# Patient Record
Sex: Female | Born: 1965 | Race: White | Hispanic: No | Smoking: Current every day smoker
Health system: Southern US, Community
[De-identification: ages and names within clinical notes are randomized; demographics above are authoritative.]

## PROBLEM LIST (undated history)

## (undated) DIAGNOSIS — I10 Essential (primary) hypertension: Secondary | ICD-10-CM

## (undated) DIAGNOSIS — K8689 Other specified diseases of pancreas: Secondary | ICD-10-CM

## (undated) HISTORY — PX: ABDOMINAL HYSTERECTOMY: SHX81

---

## 1997-10-24 ENCOUNTER — Emergency Department (HOSPITAL_COMMUNITY): Admission: EM | Admit: 1997-10-24 | Discharge: 1997-10-24 | Payer: Self-pay | Admitting: Internal Medicine

## 1997-10-31 ENCOUNTER — Other Ambulatory Visit: Admission: RE | Admit: 1997-10-31 | Discharge: 1997-10-31 | Payer: Self-pay | Admitting: Obstetrics and Gynecology

## 1999-07-19 ENCOUNTER — Encounter (INDEPENDENT_AMBULATORY_CARE_PROVIDER_SITE_OTHER): Payer: Self-pay

## 1999-07-19 ENCOUNTER — Inpatient Hospital Stay (HOSPITAL_COMMUNITY): Admission: RE | Admit: 1999-07-19 | Discharge: 1999-07-22 | Payer: Self-pay | Admitting: Obstetrics & Gynecology

## 1999-10-30 ENCOUNTER — Observation Stay (HOSPITAL_COMMUNITY): Admission: RE | Admit: 1999-10-30 | Discharge: 1999-10-31 | Payer: Self-pay | Admitting: Obstetrics & Gynecology

## 1999-10-30 ENCOUNTER — Encounter (INDEPENDENT_AMBULATORY_CARE_PROVIDER_SITE_OTHER): Payer: Self-pay | Admitting: Specialist

## 2000-10-12 ENCOUNTER — Encounter: Payer: Self-pay | Admitting: Internal Medicine

## 2000-10-12 ENCOUNTER — Inpatient Hospital Stay (HOSPITAL_COMMUNITY): Admission: EM | Admit: 2000-10-12 | Discharge: 2000-10-14 | Payer: Self-pay

## 2000-10-20 ENCOUNTER — Other Ambulatory Visit (HOSPITAL_COMMUNITY): Admission: RE | Admit: 2000-10-20 | Discharge: 2000-10-21 | Payer: Self-pay | Admitting: Psychiatry

## 2001-06-10 ENCOUNTER — Other Ambulatory Visit: Admission: RE | Admit: 2001-06-10 | Discharge: 2001-06-10 | Payer: Self-pay | Admitting: Obstetrics & Gynecology

## 2005-07-28 ENCOUNTER — Emergency Department (HOSPITAL_COMMUNITY): Admission: EM | Admit: 2005-07-28 | Discharge: 2005-07-28 | Payer: Self-pay | Admitting: Emergency Medicine

## 2007-06-01 ENCOUNTER — Emergency Department (HOSPITAL_COMMUNITY): Admission: EM | Admit: 2007-06-01 | Discharge: 2007-06-01 | Payer: Self-pay | Admitting: Emergency Medicine

## 2010-08-24 NOTE — Op Note (Signed)
[facility]  Patient:    Kelsey Scott, Kelsey Scott                        MRN: [id number] Proc. Date: 07/19/99 Adm. Date:  [id number] Attending:  Minette Headland                           Operative Report  PREOPERATIVE DIAGNOSIS:       Chronic pelvic pain, cystourethrocele with loss of UV angle and urinary stress incontinence, second degree rectocele, suspected adenomyosis.  POSTOPERATIVE DIAGNOSIS:      Chronic pelvic pain, cystourethrocele with loss of UV angle and urinary stress incontinence, second degree rectocele, suspected adenomyosis.  OPERATION:                    Laparoscopically-assisted vaginal hysterectomy, anterior and posterior colporrhaphy, suprapubic cystotomy with Bonanno catheter.  SURGEON:                      Freddy Finner, M.D.  ASSISTANT:                    Beather Arbour. Thomasena Edis, M.D.  ANESTHESIA:  ESTIMATED BLOOD LOSS:         100 cc.  COMPLICATIONS:                None.  INDICATIONS:                  Details of the present illness are recorded in the admission note.  FINDINGS:                     Intra-abdominal findings are recorded in still photographs which are included in the office record and they include normal appearing right ovary with evidence of recent ovulation, normal right fallopian  tube, absence of left tube and ovary, normal appendix, boggy enlargement of uterus. No apparent other abnormalities in the upper abdomen.  DESCRIPTION OF PROCEDURE:     The patient was admitted on the morning of surgery. She was placed in PAS hose.  She was given an antibiotic bolus.  She was brought to the operating room and placed under adequate general endotracheal anesthesia and placed in the dorsal lithotomy position using the [place] stirrup system. Betadine prep was carried out in the usual fashion with Betadine scrub followed by solution, prepping the entire abdomen, perineum, and vagina.  The bladder was  evacuated with a Robinson catheter.  Hulka tenaculum was attached to the cervix under direct visualization and sterile drapes were applied.  Two incisions were made in the abdomen one at the umbilicus and one just above the symphysis.  Through the upper incision, a 12 mm trocar was introduced.  Inspection revealed adequate placement with no evidence of injury on entry.  The pneumoperitoneum was allowed to accummulate.  Careful systematic examination of abdominal and pelvic contents was carried out.  Findings are noted above.  There was no visible evidence of adhesions or endometriosis.  It was elected to perform the definitive surgery vaginally at this point.  Attention was turned vaginally.  Posterior weighted vaginal retractor was placed.  Deaver retractors were used to retract the vaginal walls anteriorly and laterally.  Cervix was grasped with a Christella Hartigan tenaculum.  Hulka tenaculum was removed.  Posterior colpotomy incision was made while tenting the mucosa posterior to the cervix in the cul-de-sac.  The cervix was circumscribed with a scalpel to free the mucosa from the cervix.  The uterosacral pedicles were developed, crossclamped with curved Heaneys, divided sharply, and ligated with 0 Monocryl n a Heaney fashion.  The bladder pillars were taken separately, divided sharply, and ligated with 0 Monocryl.  Anterior peritoneum was entered.  Cardinal ligament pedicles, vessel pedicles, and a pedicle above the vessels on each side were taken. Each was divided sharply and ligated with 0 Monocryl.  The utero-ovarian pedicle on the right was crossclamp as was residual infundibulopelvic pedicle on the left.  Each of these was divided.  The left side was doubly ligated with free ties of  Monocryl.  The right was ligated with free tie of 0 Monocryl followed by suture  ligature of 0 Monocryl.  Angles of the vagina were anchored to the uterosacrals  with mattress suture of 0  Monocryl.  Posterior peritoneum and uterosacrals were  plicated with a single suture of 0 Monocryl.  The cuff was closed vertically leaving approximately 2 cm of anterior mucosa free for the anterior repair. The edges of this mucosa were grasped with Allis and the mucosa was tented in the midline and progressively the bladder was separated from the mucosa with blunt nd sharp dissection and the incision was made in the midline to a level approximately 2 cm from the urethral meatus.  Careful sharp and blunt dissection was carried ut until the bladder was freed from the vesicovaginal fashion.  This was plicated ith a series of interrupted 0 Monocryl sutures to elevate the urethrovesical angle nd reduce the small cystocele.  A very small segment of mucosa was excised.  The mucosa was then closed with a running 2-0 Monocryl suture.  Attention was turned first posteriorly.  The vaginal mucosa was grasped at the forchette.  On either  side, a wedge-shaped segment of tissue with base superiorly was taken from the peritoneum and removed.  The posterior mucosa was elevated with Allis, developed with blunt and sharp dissection and incision made in the midline to approximately 6 to 8 cm of length.  Careful dissection of the rectum and perirectal tissues was  carried out as well as dissection of the perineal body.  Plication sutures of 0  Monocryl were used to plicate perirectal tissues in the midline to reduce the rectocele.  The levators were plicated separately to the length and then elevated the perineal body.  The mucosa was then closed with a running 2-0 Monocryl in a  fashion similar to episiotomy.  The bladder was filled with 300 cc of irrigating solution and a suprapubic Bonanno catheter was placed without difficulty and anchored to the skin with 4-0 nylon.  A two-inch Iodoform gauze pack was placed. Reinspection abdominally with the laparoscope revealed complete  hemostasis. Additional photos were made and retained in the office record.  The procedure was terminated.  The umbilical incision was closed in two layers with a deep suture of 0 Vicryl in the fascia and interrupted 3-0 Dexon for subcuticular skin closure.   The lower incision was closed with an interrupted subcuticular suture of 3-0 Dexon and 1/4 inch Steri-Strips.  8 cc of 0.5% Marcaine was used to inject into the incision for postoperative analgesia.  The patient was awakened and taken to the recovery room in good condition. DD:  07/19/99 TD:  07/19/99 Job: [id number]

## 2010-08-24 NOTE — Discharge Summary (Signed)
[facility]  Patient:    Kelsey Scott, Kelsey Scott                          MRN: [id number] Adm. Date:  10/30/99 Disc. Date: 10/31/99 Attending:  Freddy Finner, M.D.                           Discharge Summary  DISCHARGE DIAGNOSIS:          Rectovaginal fistula.  OPERATIVE PROCEDURE:          Resection of rectovaginal fistula and perioneoplasty.  COMPLICATIONS:                None.  DISPOSITION:                  The patient is in satisfactory condition at the time of discharge.  She is to have no vaginal entry.  She is to take a regular diet.  She is to call for fever or heavy bleeding.  She is discharged on Ceftin 500 mg b.i.d. for five days.  She is given Tylox to be taken p.r.n. pain.  She is to do Sitz baths b.i.d. for at least 10 minutes.  She is given Diflucan to be used as needed for yeast vaginitis.  She is to return to the office in one week for her first postoperative visit.  She is to take a stool softener and encouraged to force p.o. fluids.  The patient was admitted ______ after resection of rectovaginal fistula on the day of admission.  Her physical findings on admission were remarkable only for the presence of the fistula.  LABORATORY DATA:              CBC with hemoglobin 14.  Normal urinalysis. Postoperative hemoglobin 11.3.  HOSPITAL COURSE:              The patient was admitted and observed overnight. She did have some emesis, which responded to antiemetics.  She remained afebrile.  On the morning after surgery, she was alert and oriented, ambulating without difficulty, having adequate bladder function and adequate pain control with p.o. medications.  She was discharged to home with disposition as noted above. DD:  10/31/99 TD:  11/02/99 Job: [id number]

## 2010-08-24 NOTE — Op Note (Signed)
[facility]  Patient:    Kelsey Scott, Kelsey Scott                          MRN: [id number] Proc. Date: 10/30/99 Attending:  Freddy Finner, M.D.                           Operative Report  PREOPERATIVE DIAGNOSIS:  Rectovaginal fistula.  POSTOPERATIVE DIAGNOSIS:  Rectovaginal fistula.  OPERATION:  Excision of rectovaginal fistula and perineoplasty.  SURGEON:  Freddy Finner, M.D.  ANESTHESIA:  General.  ESTIMATED INTRAOPERATIVE BLOOD LOSS:  50 cc.  INTRAOPERATIVE COMPLICATIONS:  None.  INDICATIONS:  The patient had laparoscopically-assisted vaginal hysterectomy and anterior and posterior vagina repair on April 12 of this year. Postoperatively, after approximately three to four weeks, she noticed passage of gas rectally.  A small fistula was identified.  She was treated with antibiotics, sitz baths, stool softeners, and has been followed now for a period of approximately 12 to 13 weeks with persistence of her symptoms and with complete healing of the posterior vaginal repair.  She is now admitted for excision of rectovaginal fistula.  DESCRIPTION OF PROCEDURE:  She was admitted on the morning of surgery. She was given a gram of Ceftin IV preoperatively.  She had a modified bowel prep preoperatively.  She was taken to the operating room where she was placed under adequate general endotracheal anesthesia, placed in the dorsolithotomy position using the [employer] system.  Betadine prep of perineum and vagina was carried out.  The perineal or lower vaginal portion of the fistula was identified.  Digitally, the rectum was explored and, using scalpel with a #15 blade, a mucosal incision was made in elliptical fashion that included the rectovaginal fistula superficially.  A scar track could be felt extending for a distance of approximately 2 cm superiorly along the rectovaginal shelf. With careful and sharp dissection, this entire tract was excised.  Edges  of the mucosa were grasped with Allis as the procedure progressed.  After completely excising the track, there was an approximately 1 cm defect in the rectal mucosa.  The first layer of this closure was a running 3-0 Monocryl.  A second layer of closure was used.  This was the submucosal closure for the first layer and a more superficial suture for the second layer.  Interrupted sutures of 0 Monocryl were then placed to reapproximate the sphincter, perineum, and the rectovaginal shelf.  Mucosa was closed with running 3-0 Monocryl in the fascia similar to a episiotomy.  Hemostasis was adequate on completion of the procedure.  Counts were correct.  The patient was awakened and take to the recovery room in good condition. DD:  10/30/99 TD:  10/30/99 Job: [id number]

## 2010-08-24 NOTE — Discharge Summary (Signed)
[facility]  Patient:    Kelsey Scott, Kelsey Scott                        MRN: [id number] Adm. Date:  [id number] Disc. Date: [id number] Attending:  Minette Headland                           Discharge Summary  DISCHARGE DIAGNOSES:          1. Chronic pelvic pain.                               2. Severe dysmenorrhea and menorrhagia unresponsive                                  to conservative management.                               3. Symptomatic cystourethrocele, first degree.                               4. Second degree rectocele, symptomatic.  DISPOSITION:                  The patient was in satisfactory improved condition at the time of her discharge.  She was given instructions for no heavy lifting or vaginal entry.  She was discharged home with suprapubic catheter in place and appropriate instructions.  She was given Tylenol No.3 for pain and instructed to take ibuprofen 600 mg every six hours.  She was given appropriate instructions or residuals.  She is to return to the office in two weeks or when residuals are at the appropriate level.  She is to take a regular diet.  HISTORY OF PRESENT ILLNESS: PAST MEDICAL HISTORY: FAMILY HISTORY: REVIEW OF SYSTEMS: PHYSICAL EXAMINATION:         Recorded in the admission note.  Briefly, the physical findings on admission are remarkable for the pelvic findings.  There was loss of the urethrovesical angle with a very small cystocele.  There was a second degree rectocele with prolapse to the introitus with valsalva.  Bimanual examination revealed the uterus to be slightly increased in size and very tender to palpation.  LABORATORY DATA:              Not included in the medical record at this point n time.  I know from her postoperative notes that her hemoglobin was 11.6 postoperatively.  HOSPITAL COURSE:              The patient was admitted on the morning of surgery. She was given Cefotan 1 gram IV  and she was placed in PAS hose.  She was brought to the operating room where the above described procedure was accomplished.  Her postoperative course was entirely unremarkable for any significant complications. She remained afebrile throughout.  By the morning of the third postoperative day, she was voiding some, but still had large residuals.  Her condition was otherwise considered to be good and she was discharged home with disposition as noted above. DD:  09/03/99 TD:  09/04/99 Job: [id number]

## 2010-12-28 LAB — POCT I-STAT CREATININE: Operator id: 285841

## 2010-12-28 LAB — POCT CARDIAC MARKERS
CKMB, poc: <1 — ABNORMAL LOW
Troponin i, poc: <0.05

## 2010-12-28 LAB — CBC
HCT: 41
Hemoglobin: 13.7
MCHC: 33.5
MCV: 89.8
Platelets: 279
RBC: 4.57
RDW: 13.1
WBC: 5.7

## 2010-12-28 LAB — DIFFERENTIAL
Basophils Absolute: 0
Eosinophils Absolute: 0.1
Eosinophils Relative: 2
Lymphocytes Relative: 32
Neutrophils Relative %: 52

## 2010-12-28 LAB — I-STAT 8, (EC8 V) (CONVERTED LAB)
BUN: 14
Bicarbonate: 25.3 — ABNORMAL HIGH
Glucose, Bld: 164 — ABNORMAL HIGH
Operator id: 285841
TCO2: 27
pCO2, Ven: 44 — ABNORMAL LOW

## 2010-12-28 LAB — D-DIMER, QUANTITATIVE: D-Dimer, Quant: 0.51 — ABNORMAL HIGH

## 2016-03-06 ENCOUNTER — Other Ambulatory Visit: Payer: Self-pay | Admitting: Student

## 2016-03-06 DIAGNOSIS — R1010 Upper abdominal pain, unspecified: Secondary | ICD-10-CM

## 2016-03-08 ENCOUNTER — Other Ambulatory Visit: Payer: Self-pay | Admitting: Family Medicine

## 2016-03-08 DIAGNOSIS — R1010 Upper abdominal pain, unspecified: Secondary | ICD-10-CM

## 2016-03-13 ENCOUNTER — Other Ambulatory Visit: Payer: Self-pay

## 2016-03-18 ENCOUNTER — Other Ambulatory Visit: Payer: Self-pay | Admitting: Family Medicine

## 2016-03-18 DIAGNOSIS — R1010 Upper abdominal pain, unspecified: Secondary | ICD-10-CM

## 2017-08-06 ENCOUNTER — Emergency Department (HOSPITAL_COMMUNITY): Payer: BLUE CROSS/BLUE SHIELD

## 2017-08-06 ENCOUNTER — Emergency Department (HOSPITAL_COMMUNITY)
Admission: EM | Admit: 2017-08-06 | Discharge: 2017-08-07 | Disposition: A | Discharge status: Home / Self Care | Payer: BLUE CROSS/BLUE SHIELD | Attending: Emergency Medicine | Admitting: Emergency Medicine

## 2017-08-06 ENCOUNTER — Other Ambulatory Visit: Payer: Self-pay

## 2017-08-06 ENCOUNTER — Encounter (HOSPITAL_COMMUNITY): Payer: Self-pay

## 2017-08-06 DIAGNOSIS — K860 Alcohol-induced chronic pancreatitis: Secondary | ICD-10-CM | POA: Insufficient documentation

## 2017-08-06 DIAGNOSIS — I10 Essential (primary) hypertension: Secondary | ICD-10-CM | POA: Diagnosis not present

## 2017-08-06 DIAGNOSIS — R4589 Other symptoms and signs involving emotional state: Secondary | ICD-10-CM

## 2017-08-06 DIAGNOSIS — F17200 Nicotine dependence, unspecified, uncomplicated: Secondary | ICD-10-CM | POA: Diagnosis not present

## 2017-08-06 DIAGNOSIS — F1010 Alcohol abuse, uncomplicated: Secondary | ICD-10-CM

## 2017-08-06 DIAGNOSIS — F10229 Alcohol dependence with intoxication, unspecified: Secondary | ICD-10-CM | POA: Diagnosis not present

## 2017-08-06 DIAGNOSIS — Y908 Blood alcohol level of 240 mg/100 ml or more: Secondary | ICD-10-CM | POA: Insufficient documentation

## 2017-08-06 DIAGNOSIS — R1032 Left lower quadrant pain: Secondary | ICD-10-CM | POA: Diagnosis present

## 2017-08-06 DIAGNOSIS — F329 Major depressive disorder, single episode, unspecified: Secondary | ICD-10-CM | POA: Insufficient documentation

## 2017-08-06 HISTORY — DX: Essential (primary) hypertension: I10

## 2017-08-06 HISTORY — DX: Other specified diseases of pancreas: K86.89

## 2017-08-06 LAB — COMPREHENSIVE METABOLIC PANEL
ALT: 20 U/L (ref 14–54)
ANION GAP: 12 (ref 5–15)
AST: 37 U/L (ref 15–41)
Albumin: 3.8 g/dL (ref 3.5–5.0)
Alkaline Phosphatase: 105 U/L (ref 38–126)
BUN: 5 mg/dL — AB (ref 6–20)
CHLORIDE: 107 mmol/L (ref 101–111)
CO2: 23 mmol/L (ref 22–32)
Calcium: 9 mg/dL (ref 8.9–10.3)
Creatinine, Ser: 0.58 mg/dL (ref 0.44–1.00)
GFR calc Af Amer: >60 mL/min (ref >60)
GFR calc non Af Amer: >60 mL/min (ref >60)
GLUCOSE: 98 mg/dL (ref 65–99)
POTASSIUM: 4.2 mmol/L (ref 3.5–5.1)
SODIUM: 142 mmol/L (ref 135–145)
Total Bilirubin: 0.4 mg/dL (ref 0.3–1.2)
Total Protein: 7.6 g/dL (ref 6.5–8.1)

## 2017-08-06 LAB — I-STAT TROPONIN, ED: Troponin i, poc: 0 ng/mL (ref 0.00–0.08)

## 2017-08-06 LAB — CBC
HCT: 39.4 % (ref 36.0–46.0)
Hemoglobin: 13.3 g/dL (ref 12.0–15.0)
MCH: 32.1 pg (ref 26.0–34.0)
MCHC: 33.8 g/dL (ref 30.0–36.0)
MCV: 95.2 fL (ref 78.0–100.0)
PLATELETS: 286 10*3/uL (ref 150–400)
RBC: 4.14 MIL/uL (ref 3.87–5.11)
RDW: 14.8 % (ref 11.5–15.5)
WBC: 6.5 10*3/uL (ref 4.0–10.5)

## 2017-08-06 LAB — ETHANOL: ALCOHOL ETHYL (B): 338 mg/dL — AB (ref <10)

## 2017-08-06 LAB — LIPASE, BLOOD: LIPASE: 40 U/L (ref 11–51)

## 2017-08-06 MED ORDER — LORAZEPAM 1 MG PO TABS
0.0000 mg | ORAL_TABLET | Freq: Four times a day (QID) | ORAL | Status: DC
  Administered 2017-08-06 – 2017-08-07 (×2): 1 mg via ORAL
  Filled 2017-08-06 (×2): qty 1

## 2017-08-06 MED ORDER — LORAZEPAM 2 MG/ML IJ SOLN: 0.0000 mg | Freq: Four times a day (QID) | INTRAMUSCULAR | Status: DC

## 2017-08-06 MED ORDER — THIAMINE HCL 100 MG/ML IJ SOLN: 100.0000 mg | Freq: Every day | INTRAMUSCULAR | Status: DC

## 2017-08-06 MED ORDER — LORAZEPAM 2 MG/ML IJ SOLN: 0.0000 mg | Freq: Two times a day (BID) | INTRAMUSCULAR | Status: DC

## 2017-08-06 MED ORDER — IOPAMIDOL (ISOVUE-300) INJECTION 61%
100.0000 mL | Freq: Once | INTRAVENOUS | Status: AC | PRN
  Administered 2017-08-06: 100 mL via INTRAVENOUS

## 2017-08-06 MED ORDER — LORAZEPAM 1 MG PO TABS: 0.0000 mg | ORAL_TABLET | Freq: Two times a day (BID) | ORAL | Status: DC

## 2017-08-06 MED ORDER — VITAMIN B-1 100 MG PO TABS
100.0000 mg | ORAL_TABLET | Freq: Every day | ORAL | Status: DC
  Administered 2017-08-06: 100 mg via ORAL
  Filled 2017-08-06: qty 1

## 2017-08-06 MED ORDER — ONDANSETRON 4 MG PO TBDP
4.0000 mg | ORAL_TABLET | Freq: Once | ORAL | Status: AC
  Administered 2017-08-06: 4 mg via ORAL
  Filled 2017-08-06: qty 1

## 2017-08-06 NOTE — ED Notes (Signed)
Pt denies SI or HI to this RN  PA Josh made aware of ETOH

## 2017-08-06 NOTE — ED Provider Notes (Signed)
Patient placed in Quick Look pathway, seen and evaluated   Chief Complaint: chest pain, abdominal pain, SI  HPI: Patient complaining of chest pain and abdominal pain.  She is making comments requesting to go home to [place] her parents [place]. Family members at bedside are concerned that she is expressing SI and that she is highly intoxicated as they found a half empty bottle of liquor with her. Family reports that she has been consuming a 5th per day. She may have fallen on her hip while intoxicated as she has been complaining of left hip pain and has not been medically evaluated for all this. She has also experienced watery diarrhea. nausea without vomiting.  ROS: (+) SI, C/P, LLQ pain, nausea, dry heaving, no sob,  Physical Exam:   Gen: No distress  Neuro: Awake and Alert  Skin: Warm    Focused Exam: patient is very dramatic and jumping on exam when palpating her LLQ and left hip with somewhat inconsistent exam. Lungs cta bilaterally, patient is labile and intoxicated appearing, afebrile, speaking in full sentences without increased work of breathing.  Initiation of care has begun. The patient has been counseled on the process, plan, and necessity for staying for the completion/evaluation, and the remainder of the medical screening examination   Gregary Cromer 08/06/17 Janeece Agee, MD 08/09/17 1218

## 2017-08-06 NOTE — ED Notes (Signed)
Patient transported to CT 

## 2017-08-06 NOTE — ED Provider Notes (Signed)
[facility] EMERGENCY DEPARTMENT Provider Note   CSN: [id number] Arrival date & time: 08/06/17  1849     History   Chief Complaint Chief Complaint  Patient presents with  . Chest Pain  . Abdominal Pain    HPI Kelsey Scott is a 52 y.o. female.  Patient with history of alcohol abuse presents to the emergency department with complaint of chest pain and abdominal pain.  Patient states that she had a problem with her pancreas and needed to heal.  She reports chronic pain in her chest and abdomen that is worse with palpation and movement.  Family brought patient to the hospital today because of a reported fall today.  Patient fell onto her left hip and complains of pain there.  She has been able to ambulate but hip is worse with bearing weight.  She reports some watery, nonbloody diarrhea.  She has had nausea but no vomiting.  She denies any history of seizures or hallucinations when stopping alcohol.  She has not been sober for very long time.  No treatments PTA.   In addition, patient is voiced some suicidal ideations to the family stating that she would like to "go home to be with her parents".  When asked directly, patient denies current suicidal or homicidal ideationa, however she does admit to being very depressed and gets very tearful.  The onset of this condition was acute. The course is constant. Alleviating factors: none.       Past Medical History:  Diagnosis Date  . Hypertension   . Pancreatic mass     There are no active problems to display for this patient.   Past Surgical History:  Procedure Laterality Date  . ABDOMINAL HYSTERECTOMY       OB History   None      Home Medications    Prior to Admission medications   Not on File    Family History No family history on file.  Social History Social History   Tobacco Use  . Smoking status: Current Every Day Smoker    Packs/day: 2.00  . Smokeless tobacco: Never Used  Substance Use  Topics  . Alcohol use: Yes    Comment: pt reports she drank about a fifth of liquor today 08-06-17  . Drug use: Not Currently     Allergies   Sulfa antibiotics   Review of Systems Review of Systems  Constitutional: Negative for fever.  HENT: Negative for rhinorrhea and sore throat.   Eyes: Negative for redness.  Respiratory: Negative for cough and shortness of breath.   Cardiovascular: Positive for chest pain (mid-chest).  Gastrointestinal: Positive for abdominal pain, diarrhea and nausea. Negative for vomiting.  Genitourinary: Negative for dysuria.  Musculoskeletal: Negative for myalgias.  Skin: Negative for rash.  Neurological: Negative for headaches.     Physical Exam Updated Vital Signs BP 118/86   Pulse 81   Temp 98.6 F (37 C) (Oral)   Resp 19   Ht  (1.6 m)   Wt 43.1 kg (95 lb)   SpO2 98%   BMI 16.83 kg/m   Physical Exam  Constitutional: She appears well-developed. She appears cachectic.  HENT:  Head: Normocephalic and atraumatic.  Mouth/Throat: Oropharynx is clear and moist.  Eyes: Conjunctivae are normal. Right eye exhibits no discharge. Left eye exhibits no discharge.  Neck: Normal range of motion. Neck supple.  Cardiovascular: Normal rate, regular rhythm and normal heart sounds.  No murmur heard. Pulmonary/Chest: Effort normal and breath  sounds normal. No respiratory distress. She has no decreased breath sounds. She has no wheezes.  Abdominal: Soft. There is tenderness (Patient jumps with light palpation, tenderness is generalized). There is no rebound and no guarding.  Neurological: She is alert.  Skin: Skin is warm and dry.  Psychiatric: Her speech is normal. Her mood appears not anxious. Her affect is labile. She is agitated. She is not aggressive. She exhibits a depressed mood. She expresses no homicidal ideation.  Nursing note and vitals reviewed.    ED Treatments / Results  Labs (all labs ordered are listed, but only abnormal results are  displayed) Labs Reviewed  ETHANOL - Abnormal; Notable for the following components:      Result Value   Alcohol, Ethyl (B) 338 (*)    All other components within normal limits  COMPREHENSIVE METABOLIC PANEL - Abnormal; Notable for the following components:   BUN 5 (*)    All other components within normal limits  CBC  LIPASE, BLOOD  URINALYSIS, ROUTINE W REFLEX MICROSCOPIC  RAPID URINE DRUG SCREEN, HOSP PERFORMED  I-STAT TROPONIN, ED    EKG Interpretation  Date/Time:  Wednesday Aug 06 2017 18:59:25 EDT Ventricular Rate:  83 PR Interval:  200 QRS Duration: 68 QT Interval:  418 QTC Calculation: 491 R Axis:   108 Text Interpretation:  Normal sinus rhythm Rightward axis Low voltage QRS Septal infarct , age undetermined Abnormal ECG No old tracing to compare Confirmed by Dione Booze ([id number]) on 08/06/2017 11:38:16 PM        Radiology Dg Chest 2 View  Result Date: 08/06/2017 CLINICAL DATA:  52 year old female with a history of chest pain and abdominal pain EXAM: CHEST - 2 VIEW COMPARISON:  None. FINDINGS: The heart size and mediastinal contours are within normal limits. Both lungs are clear. The visualized skeletal structures are unremarkable. Surgical changes of bilateral breast reconstruction/augmentation IMPRESSION: No active cardiopulmonary disease. Electronically Signed   By: Gilmer Mor D.O.   On: 08/06/2017 20:02   Ct Abdomen Pelvis W Contrast  Result Date: 08/07/2017 CLINICAL DATA:  Abdominal pain, nausea and diarrhea. History of pancreatitis and alcohol abuse. EXAM: CT ABDOMEN AND PELVIS WITH CONTRAST TECHNIQUE: Multidetector CT imaging of the abdomen and pelvis was performed using the standard protocol following bolus administration of intravenous contrast. CONTRAST:  ISOVUE-300 IOPAMIDOL (ISOVUE-300) INJECTION 61% COMPARISON:  None. FINDINGS: LOWER CHEST: No basilar pulmonary nodules or pleural effusion. No apical pericardial effusion. HEPATOBILIARY: There is  hypoattenuation of the liver relative to the spleen, but insufficient to diagnose hepatic steatosis. Gallbladder is normal. There is mild dilatation of the common bile duct, measuring up to 9 mm. There is smooth tapering distally without visible choledocholithiasis. PANCREAS: There is a cystic lesion within the pancreatic neck measuring 12 x 10 mm. A smaller cystic lesion in the pancreatic body measures 7 mm and likely communicates with the pancreatic duct. The head of the pancreas is mildly enlarged and somewhat indistinct. There is no peripancreatic fluid collection. SPLEEN: Normal. ADRENALS/URINARY TRACT: --Adrenal glands: Normal. --Right kidney/ureter: No hydronephrosis, nephroureterolithiasis, perinephric stranding or solid renal mass. --Left kidney/ureter: No hydronephrosis, nephroureterolithiasis, perinephric stranding or solid renal mass. --Urinary bladder: Normal for degree of distention STOMACH/BOWEL: --Stomach/Duodenum: No hiatal hernia or other gastric abnormality. Normal duodenal course. --Small bowel: No dilatation or inflammation. --Colon: There is fatty infiltration of the wall of the cecum and ascending colon which may be a sequela of chronic inflammation. No acute inflammation is noted. --Appendix: Not visualized. No right lower quadrant  inflammation or free fluid. VASCULAR/LYMPHATIC: Atherosclerotic calcification is present within the non-aneurysmal abdominal aorta, without hemodynamically significant stenosis. The portal vein, splenic vein, superior mesenteric vein and IVC are patent. no abdominal or pelvic lymphadenopathy. REPRODUCTIVE: Status post hysterectomy. No adnexal mass. MUSCULOSKELETAL. 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 OTHER: None. IMPRESSION: 1. Enlarged and somewhat indistinct appearance of the pancreatic head may indicate early/mild acute on chronic pancreatitis. No peripancreatic fluid collection. 2. Multiple cystic lesions within the  pancreatic neck and body. Further characterization is recommended with abdominal MRI with and without contrast. This would also be useful for further assessing the pancreatic head to exclude a superimposed mass. This could be obtained on a nonemergent basis. 3. Mildly dilated common bile duct without visible choledocholithiasis. 4.  Aortic Atherosclerosis (ICD10-I70.0). Electronically Signed   By: Deatra Robinson M.D.   On: 08/07/2017 00:02   Dg Hip Unilat W Or Wo Pelvis 2-3 Views Left  Result Date: 08/06/2017 CLINICAL DATA:  Hip pain after fall. EXAM: DG HIP (WITH OR WITHOUT PELVIS) 2-3V LEFT COMPARISON:  None. FINDINGS: There is no evidence of hip fracture or dislocation. There is no evidence of arthropathy or other focal bone abnormality. Phleboliths project in the pelvis. IMPRESSION: Negative. Electronically Signed   By: Awilda Metro M.D.   On: 08/06/2017 20:01    Procedures Procedures (including critical care time)  Medications Ordered in ED Medications  LORazepam (ATIVAN) injection 0-4 mg ( Intravenous See Alternative 08/06/17 2119)    Or  LORazepam (ATIVAN) tablet 0-4 mg (1 mg Oral Given 08/06/17 2119)  LORazepam (ATIVAN) injection 0-4 mg (has no administration in time range)    Or  LORazepam (ATIVAN) tablet 0-4 mg (has no administration in time range)  thiamine (VITAMIN B-1) tablet 100 mg (100 mg Oral Given 08/06/17 2119)    Or  thiamine (B-1) injection 100 mg ( Intravenous See Alternative 08/06/17 2119)  ondansetron (ZOFRAN-ODT) disintegrating tablet 4 mg (4 mg Oral Given 08/06/17 2118)  iopamidol (ISOVUE-300) 61 % injection 100 mL (100 mLs Intravenous Contrast Given 08/06/17 2332)     Initial Impression / Assessment and Plan / ED Course  I have reviewed the triage vital signs and the nursing notes.  Pertinent labs & imaging results that were available during my care of the patient were reviewed by me and considered in my medical decision making (see chart for details).     Patient  seen and examined. Work-up initiated in Triage.  Patient will need medically cleared in regards to her chest pain and abdominal pain.  CT is pending.  Patient is obviously intoxicated.  When medically cleared, patient will need TTS eval given severe depression, passive suicidal ideation per family, alcohol abuse.  Vital signs reviewed and are as follows: BP 118/86   Pulse 81   Temp 98.6 F (37 C) (Oral)   Resp 15   Ht  (1.6 m)   Wt 43.1 kg (95 lb)   SpO2 93%   BMI 16.83 kg/m   12:21 AM Patient more calm with ativan. Ativan with mild generalized tenderness. CT performed. Shows chronic pancreatic changes due to alcohol use. Pt is tolerating fluids and is not vomiting. Lipase normal.   Discussed with Dr. Dalene Seltzer.   Will proceed with TTS consult as patient is medically cleared. Will continue PO clear fluids.   1:05 AM TTS consult completed. Pt refuses admission for detox. No active SI. Pt wants to go home. She wants to do outpatient detox.  Counseled on clear liquids. Pt states that she intends to quit drinking. Rx zofran, librium. Will need close outpt follow-up, referrals given.   Final Clinical Impressions(s) / ED Diagnoses   Final diagnoses:  Alcohol-induced chronic pancreatitis (HCC)  Depressed mood  Alcohol abuse   Pt with abd pain, abnormal CT that will need to be followed up by primary care physician.  She may have an element of acute on chronic pancreatitis, however lipase is normal and patient is tolerating oral fluids.  Abdomen pain is controlled.  She is not having any signs of withdrawal in the ED.  TTS evaluation confirms no acute criteria for inpatient psych admission.  Patient wants to detox as an outpatient.  Will provide with medications to help her do so.  Strongly encouraged follow-up.  Referrals given.   ED Discharge Orders        Ordered    ondansetron (ZOFRAN ODT) 4 MG disintegrating tablet  Every 8 hours PRN     08/07/17 0112    chlordiazePOXIDE  (LIBRIUM) 25 MG capsule     08/07/17 0112       Renne Crigler, PA-C 08/07/17 0115    Alvira Monday, MD 08/09/17 1242

## 2017-08-06 NOTE — ED Triage Notes (Signed)
Pt arrived with daughter, reports pain in chest and in her belly. Daughter at bedside reports this has been going on for months. Pt has mass on pancreatitis per daughter due to drinking, but pt states she drinks to get rid of the pain.

## 2017-08-07 MED ORDER — CHLORDIAZEPOXIDE HCL 25 MG PO CAPS: ORAL_CAPSULE | ORAL | 0 refills | Status: AC

## 2017-08-07 MED ORDER — ONDANSETRON 4 MG PO TBDP
4.0000 mg | ORAL_TABLET | Freq: Three times a day (TID) | ORAL | 0 refills | Status: AC | PRN
Start: 2017-08-07 — End: ?

## 2017-08-07 NOTE — ED Notes (Signed)
Pt departed in NAD. Waiting in lobby for family to pick her up.
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
## 2017-08-07 NOTE — Discharge Instructions (Signed)
Please read and follow all provided instructions.  Your diagnoses today include:  1. Alcohol-induced chronic pancreatitis (HCC)   2. Depressed mood   3. Alcohol abuse     Tests performed today include:  Blood counts and electrolytes  Blood tests to check liver and kidney function  Blood tests to check pancreas function -normal lipase  Urine test to look for infection  CT scan of your abdomen -shows abnormalities of your pancreas which will need to be followed by your primary care doctor, shows some pancreas inflammation  Alcohol test -high  Vital signs. See below for your results today.   Medications prescribed:   Zofran (ondansetron) - for nausea and vomiting   Librium-medication for alcohol withdrawal  Take any prescribed medications only as directed.  Home care instructions:   Follow any educational materials contained in this packet.  Follow-up instructions: Please follow-up with your primary care provider in the next 2 days for further evaluation of your symptoms.  You will also need to have your pancreas monitored given abnormalities noted on CT.  Follow-up with outpatient referrals given.  Return instructions:  SEEK IMMEDIATE MEDICAL ATTENTION IF:  The pain does not go away or becomes severe   A temperature above 101F develops   Repeated vomiting occurs (multiple episodes)   The pain becomes localized to portions of the abdomen. The right side could possibly be appendicitis. In an adult, the left lower portion of the abdomen could be colitis or diverticulitis.   Blood is being passed in stools or vomit (bright red or black tarry stools)   You develop chest pain, difficulty breathing, dizziness or fainting, or become confused, poorly responsive, or inconsolable (young children)  If you have any other emergent concerns regarding your health  Additional Information: Abdominal (belly) pain can be caused by many things. Your caregiver performed an  examination and possibly ordered blood/urine tests and imaging (CT scan, x-rays, ultrasound). Many cases can be observed and treated at home after initial evaluation in the emergency department. Even though you are being discharged home, abdominal pain can be unpredictable. Therefore, you need a repeated exam if your pain does not resolve, returns, or worsens. Most patients with abdominal pain don't have to be admitted to the hospital or have surgery, but serious problems like appendicitis and gallbladder attacks can start out as nonspecific pain. Many abdominal conditions cannot be diagnosed in one visit, so follow-up evaluations are very important.   Your vital signs today were: BP (!) 130/91    Pulse 76    Temp 98.6 F (37 C) (Oral)    Resp (!) 22    Ht  (1.6 m)    Wt 43.1 kg (95 lb)    SpO2 98%    BMI 16.83 kg/m  If your blood pressure (bp) was elevated above 135/85 this visit, please have this repeated by your doctor within one month. --------------

## 2017-08-07 NOTE — ED Notes (Addendum)
TTS device at bedside, awaiting [facility] assessment.   Pt given apple juice, per request, for fluid challenge.

## 2017-09-09 ENCOUNTER — Other Ambulatory Visit: Payer: Self-pay | Admitting: Family Medicine

## 2017-09-09 DIAGNOSIS — Z8719 Personal history of other diseases of the digestive system: Secondary | ICD-10-CM

## 2017-09-09 DIAGNOSIS — R1010 Upper abdominal pain, unspecified: Secondary | ICD-10-CM

## 2017-09-09 DIAGNOSIS — R110 Nausea: Secondary | ICD-10-CM

## 2017-09-09 DIAGNOSIS — K860 Alcohol-induced chronic pancreatitis: Secondary | ICD-10-CM

## 2017-09-09 DIAGNOSIS — R197 Diarrhea, unspecified: Secondary | ICD-10-CM

## 2017-09-17 ENCOUNTER — Inpatient Hospital Stay
Admission: RE | Admit: 2017-09-17 | Discharge: 2017-09-17 | Disposition: A | Discharge status: Home / Self Care | Payer: BLUE CROSS/BLUE SHIELD | Source: Ambulatory Visit | Attending: Family Medicine | Admitting: Family Medicine

## 2017-09-27 ENCOUNTER — Inpatient Hospital Stay
Admission: RE | Admit: 2017-09-27 | Discharge: 2017-09-27 | Disposition: A | Discharge status: Home / Self Care | Payer: BLUE CROSS/BLUE SHIELD | Source: Ambulatory Visit | Attending: Family Medicine | Admitting: Family Medicine

## 2017-10-24 ENCOUNTER — Inpatient Hospital Stay: Admission: RE | Admit: 2017-10-24 | Payer: BLUE CROSS/BLUE SHIELD | Source: Ambulatory Visit

## 2018-01-23 ENCOUNTER — Other Ambulatory Visit: Payer: BLUE CROSS/BLUE SHIELD

## 2018-02-06 DEATH — deceased

## 2019-07-12 IMAGING — CT CT ABD-PELV W/ CM
2 of 5 series · 15 of 46 positions shown, 17 images · IV contrast (iopamidol)
Comparison: None.

CLINICAL DATA: Abdominal pain, nausea and diarrhea. History of
pancreatitis and alcohol abuse.

EXAM:
CT ABDOMEN AND PELVIS WITH CONTRAST
TECHNIQUE: Multidetector CT imaging of the abdomen and pelvis was performed
using the standard protocol following bolus administration of
intravenous contrast.
CONTRAST:  100mL [id number] IOPAMIDOL ([id number]) INJECTION 61%

[Series 3: abdomen 5.0 · axial · 0.71mm/px · z∈[+626,+1021]mm · 12 of 93 slices shown, 14 images]
[im 7/93  soft-tissue]
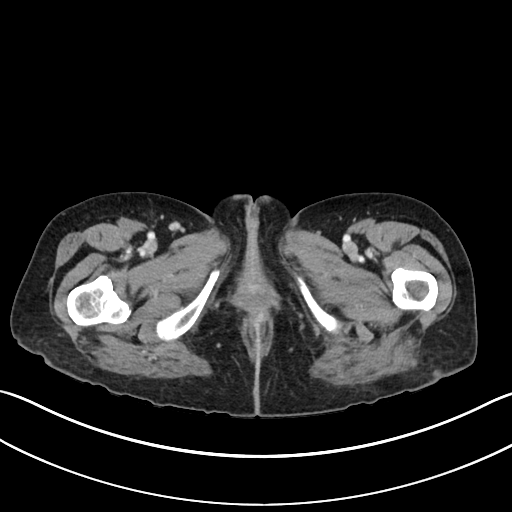
[im 7/93  bone]
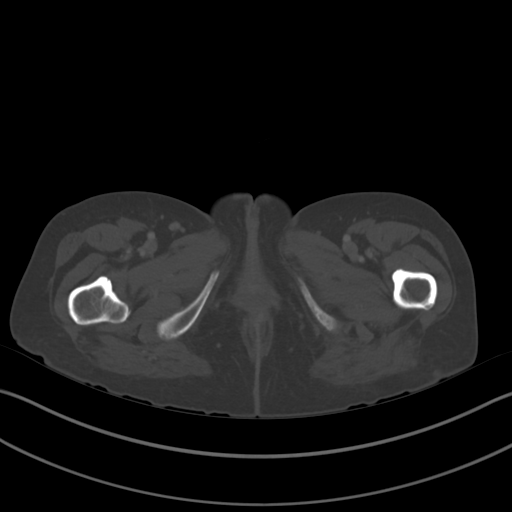
[im 13/93  soft-tissue]
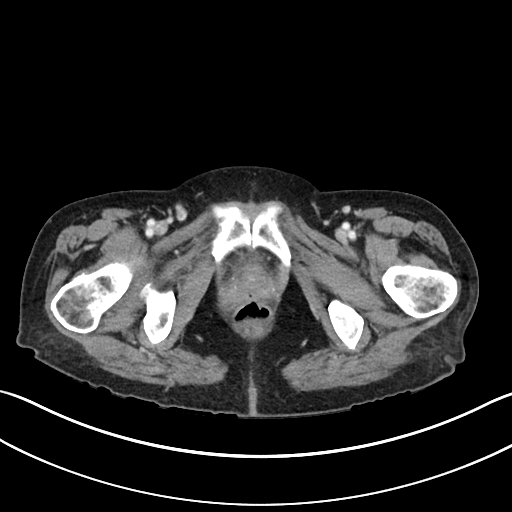
[im 19/93  soft-tissue]
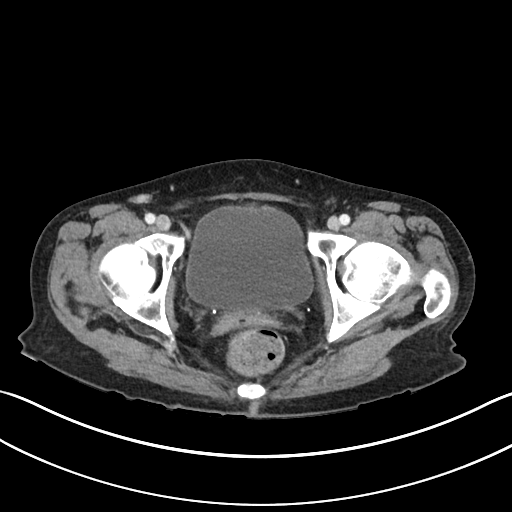
[im 31/93  soft-tissue]
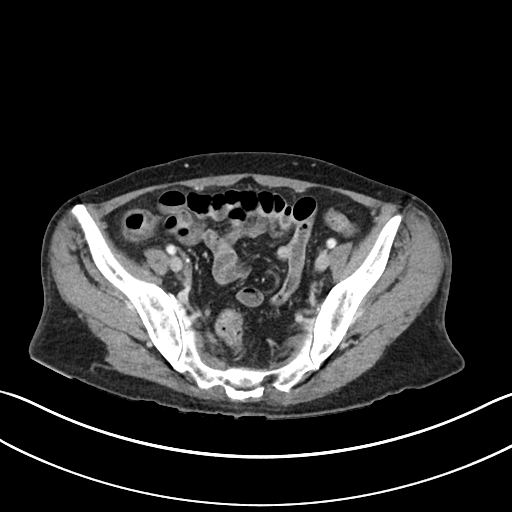
[im 37/93  soft-tissue]
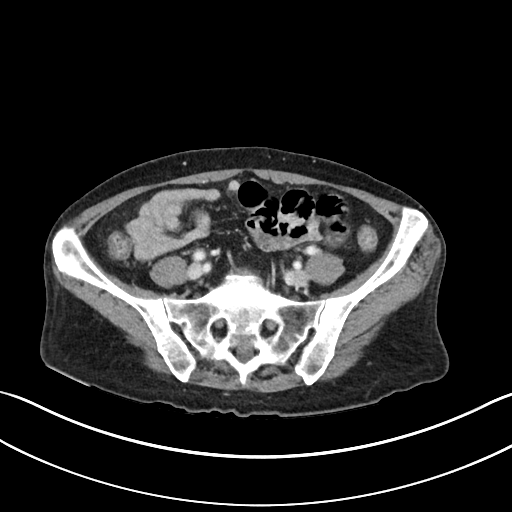
[im 43/93  soft-tissue]
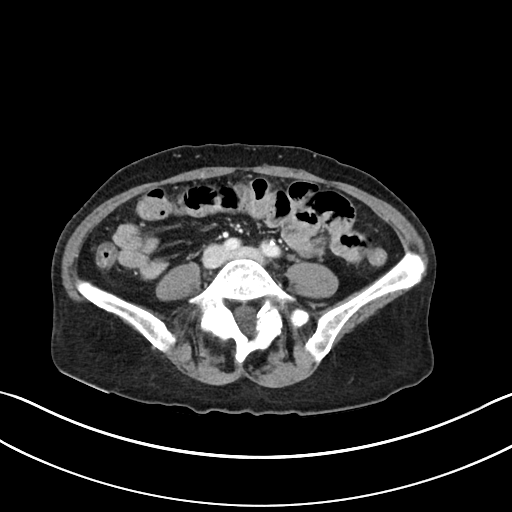
[im 50/93  soft-tissue]
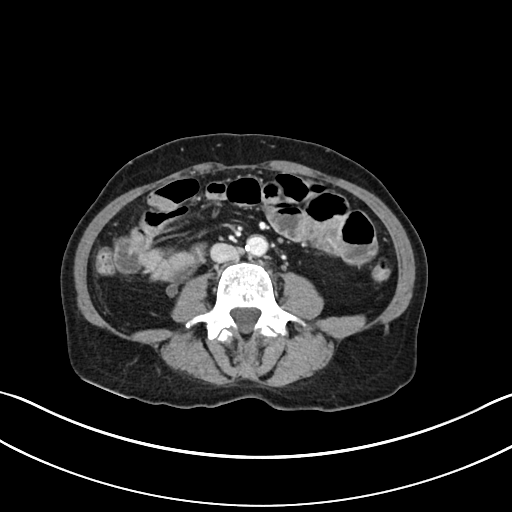
[im 56/93  soft-tissue]
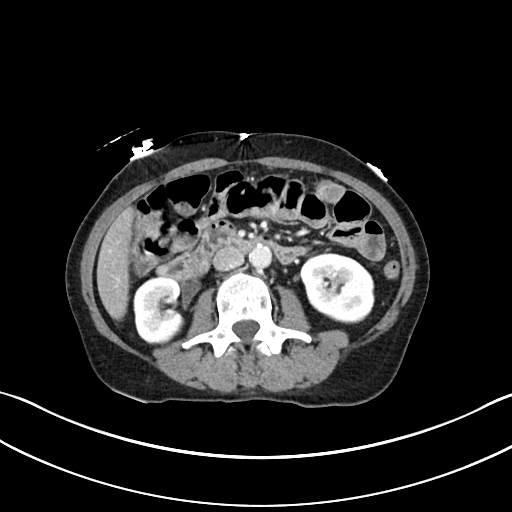
[im 62/93  soft-tissue]
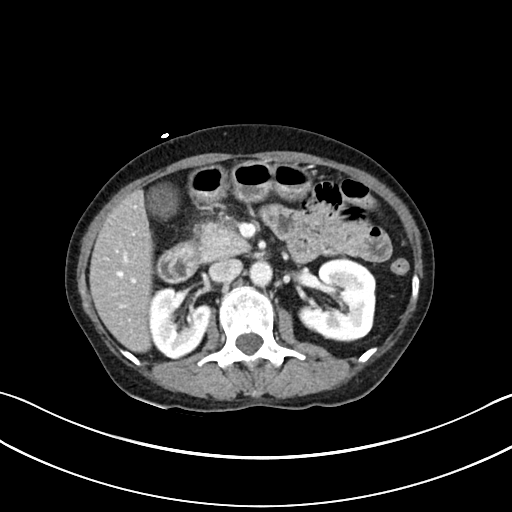
[im 62/93  bone]
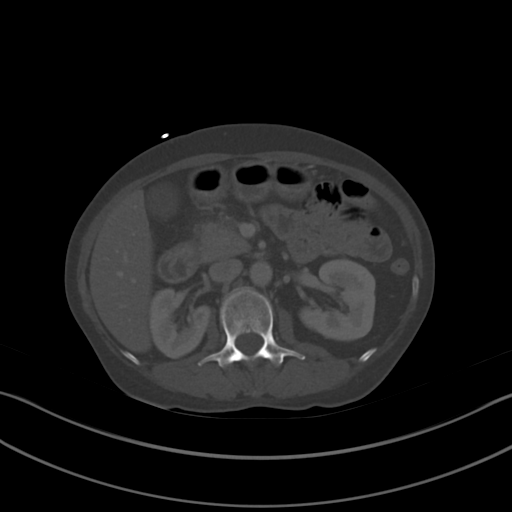
[im 74/93  soft-tissue]
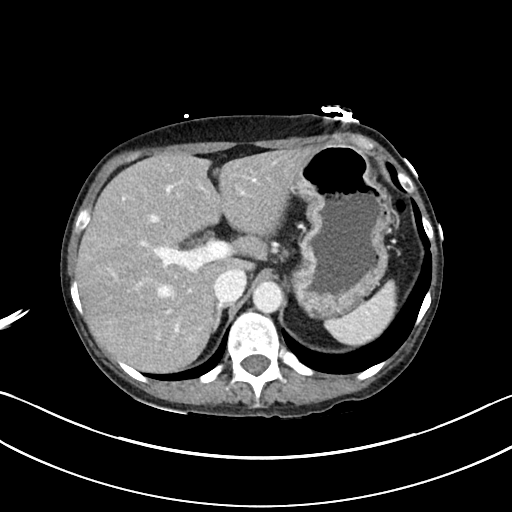
[im 80/93  soft-tissue]
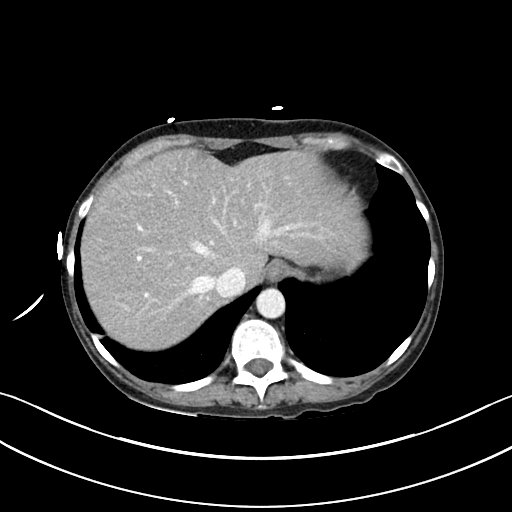
[im 86/93  soft-tissue]
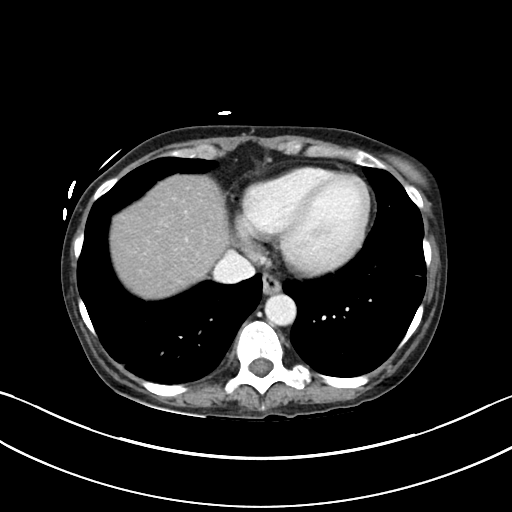

[Series 6: abdomen 3.0 mpr cor · coronal · 0.76mm/px · 3 of 77 slices shown]
[im 26/77  soft-tissue]
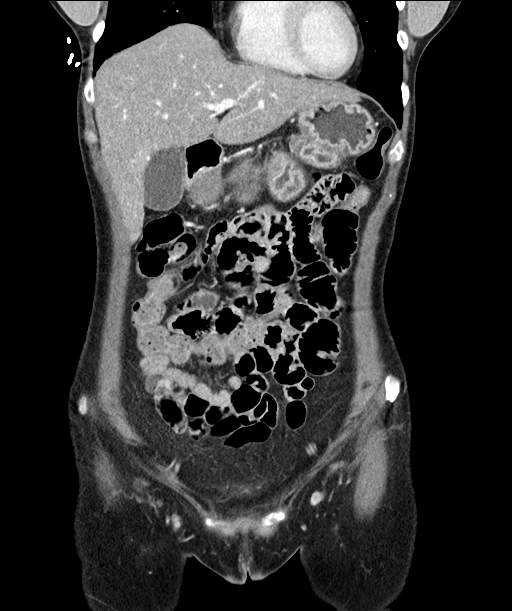
[im 34/77  soft-tissue]
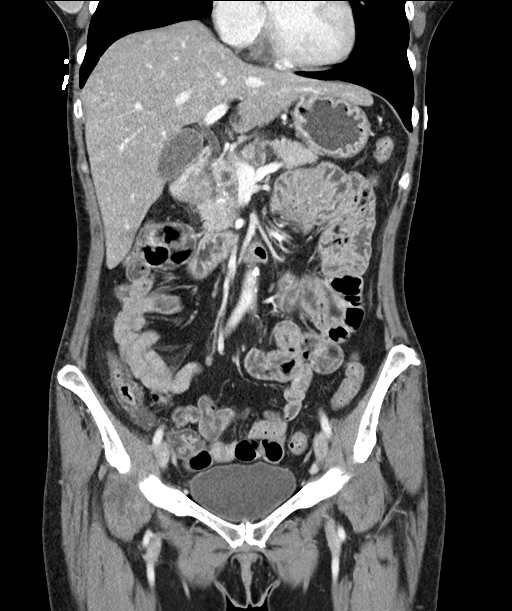
[im 43/77  soft-tissue]
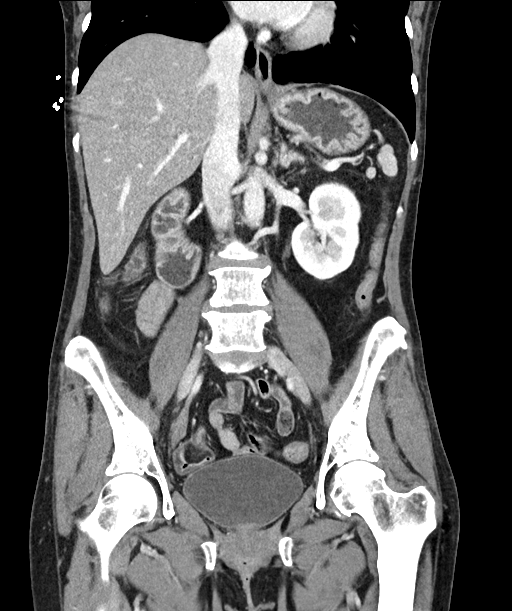

[15 of 46 positions shown; findings below may reference images not displayed]

FINDINGS: LOWER CHEST: No basilar pulmonary nodules or pleural effusion. No
apical pericardial effusion.

HEPATOBILIARY: There is hypoattenuation of the liver relative to the
spleen, but insufficient to diagnose hepatic steatosis. Gallbladder
is normal. There is mild dilatation of the common bile duct,
measuring up to 9 mm. There is smooth tapering distally without
visible choledocholithiasis.

PANCREAS: There is a cystic lesion within the pancreatic neck
measuring 12 x 10 mm. A smaller cystic lesion in the pancreatic body
measures 7 mm and likely communicates with the pancreatic duct. The
head of the pancreas is mildly enlarged and somewhat indistinct.
There is no peripancreatic fluid collection.

SPLEEN: Normal.

ADRENALS/URINARY TRACT:

--Adrenal glands: Normal.

--Right kidney/ureter: No hydronephrosis, nephroureterolithiasis,
perinephric stranding or solid renal mass.

--Left kidney/ureter: No hydronephrosis, nephroureterolithiasis,
perinephric stranding or solid renal mass.

--Urinary bladder: Normal for degree of distention

STOMACH/BOWEL:

--Stomach/Duodenum: No hiatal hernia or other gastric abnormality.
Normal duodenal course.

--Small bowel: No dilatation or inflammation.

--Colon: There is fatty infiltration of the wall of the cecum and
ascending colon which may be a sequela of chronic inflammation. No
acute inflammation is noted.

--Appendix: Not visualized. No right lower quadrant inflammation or
free fluid.

VASCULAR/LYMPHATIC: Atherosclerotic calcification is present within
the non-aneurysmal abdominal aorta, without hemodynamically
significant stenosis. The portal vein, splenic vein, superior
mesenteric vein and IVC are patent. no abdominal or pelvic
lymphadenopathy.

REPRODUCTIVE: Status post hysterectomy. No adnexal mass.

MUSCULOSKELETAL. Left L5 pars interarticularis defect with grade 1
L5-S1 anterolisthesis.

OTHER: None.
IMPRESSION: 1. Enlarged and somewhat indistinct appearance of the pancreatic
head may indicate early/mild acute on chronic pancreatitis. No
peripancreatic fluid collection.
2. Multiple cystic lesions within the pancreatic neck and body.
Further characterization is recommended with abdominal MRI with and
without contrast. This would also be useful for further assessing
the pancreatic head to exclude a superimposed mass. This could be
obtained on a nonemergent basis.

3. Mildly dilated common bile duct without visible
choledocholithiasis.
4.  Aortic Atherosclerosis ([id number]).
